# Patient Record
Sex: Male | Born: 1960 | Race: White | Hispanic: No | Smoking: Never smoker
Health system: Southern US, Community
[De-identification: ages and names within clinical notes are randomized; demographics above are authoritative.]

## PROBLEM LIST (undated history)

## (undated) DIAGNOSIS — E785 Hyperlipidemia, unspecified: Secondary | ICD-10-CM

## (undated) DIAGNOSIS — I10 Essential (primary) hypertension: Secondary | ICD-10-CM

## (undated) DIAGNOSIS — E119 Type 2 diabetes mellitus without complications: Secondary | ICD-10-CM

---

## 2003-12-12 ENCOUNTER — Ambulatory Visit: Payer: Self-pay | Admitting: Family Medicine

## 2003-12-13 ENCOUNTER — Ambulatory Visit: Payer: Self-pay | Admitting: Family Medicine

## 2003-12-18 ENCOUNTER — Emergency Department: Payer: Self-pay | Admitting: Emergency Medicine

## 2004-03-08 ENCOUNTER — Emergency Department: Payer: Self-pay | Admitting: Emergency Medicine

## 2004-03-30 ENCOUNTER — Ambulatory Visit: Payer: Self-pay | Admitting: Surgery

## 2004-06-04 ENCOUNTER — Emergency Department: Payer: Self-pay | Admitting: Emergency Medicine

## 2004-11-12 ENCOUNTER — Emergency Department: Payer: Self-pay | Admitting: Emergency Medicine

## 2007-03-16 ENCOUNTER — Ambulatory Visit: Payer: Self-pay | Admitting: Internal Medicine

## 2007-04-29 ENCOUNTER — Ambulatory Visit: Payer: Self-pay | Admitting: Family Medicine

## 2008-05-20 ENCOUNTER — Ambulatory Visit: Payer: Self-pay | Admitting: Family Medicine

## 2008-11-11 ENCOUNTER — Ambulatory Visit: Payer: Self-pay | Admitting: Internal Medicine

## 2008-12-20 ENCOUNTER — Emergency Department: Payer: Self-pay | Admitting: Internal Medicine

## 2010-10-04 ENCOUNTER — Ambulatory Visit: Payer: Self-pay | Admitting: Family Medicine

## 2010-10-30 ENCOUNTER — Emergency Department: Payer: Self-pay | Admitting: Emergency Medicine

## 2012-06-04 IMAGING — CT CT ABD-PELV W/ CM
1 of 2 series · 15 of 32 positions shown, 19 images · non-contrast
Comparison: none

REASON FOR EXAM: (1) LLQ  pain  high wbc; (2) same
COMMENTS:

PROCEDURE:     CT  - CT ABDOMEN / PELVIS  W  - October 30, 2010  [DATE]
RESULT:     CT abdomen and pelvis dated 10/30/2010 comparison made to prior
study dated 10/04/2010.
TECHNIQUE: Helical 3 mm sections were obtained from the lung bases through
the symphysis status post intravenous administration of 100 millimeters of
Ksovue-40Z and oral contrast.

[Series 2: 3mm soft tissue · axial · 0.87mm/px · z∈[-958,-506]mm · 15 of 165 slices shown, 19 images]
[im 7/165  soft-tissue]
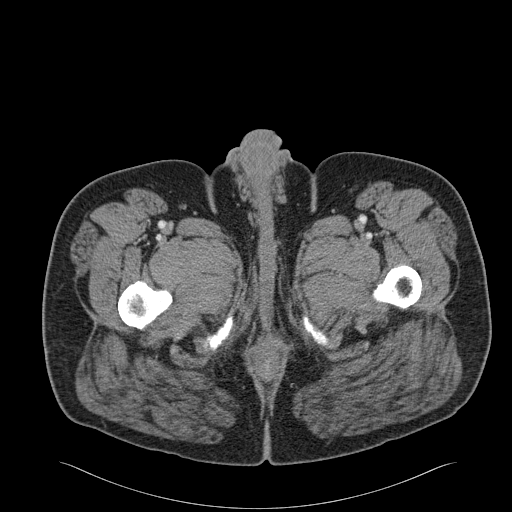
[im 7/165  bone]
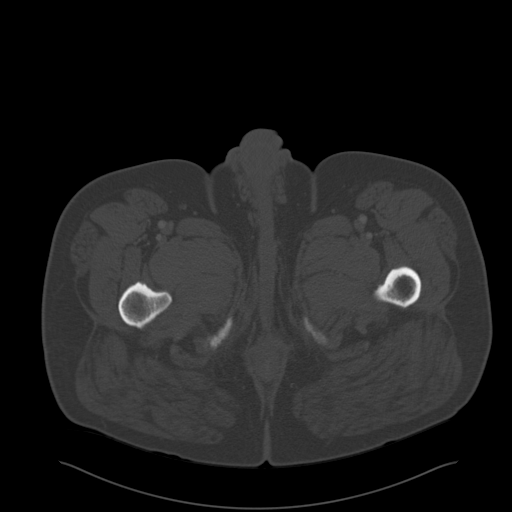
[im 21/165  soft-tissue]
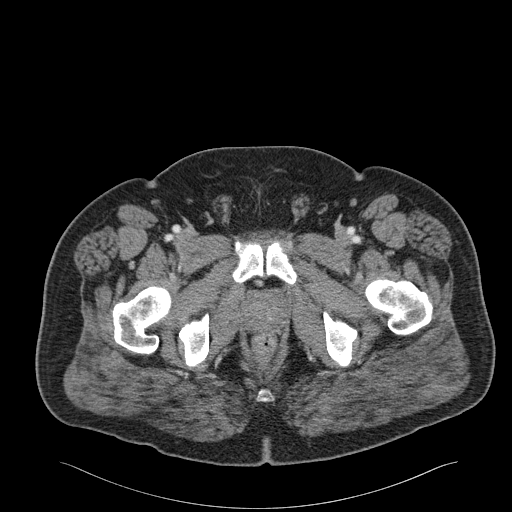
[im 35/165  soft-tissue]
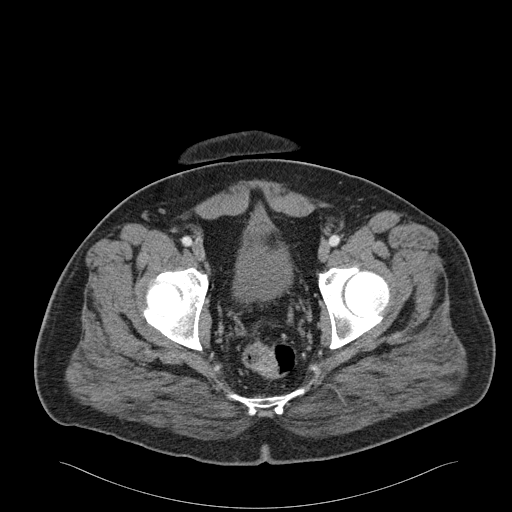
[im 48/165  soft-tissue]
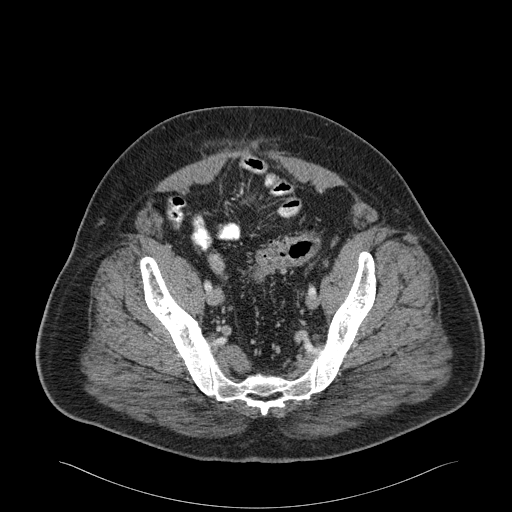
[im 55/165  soft-tissue]
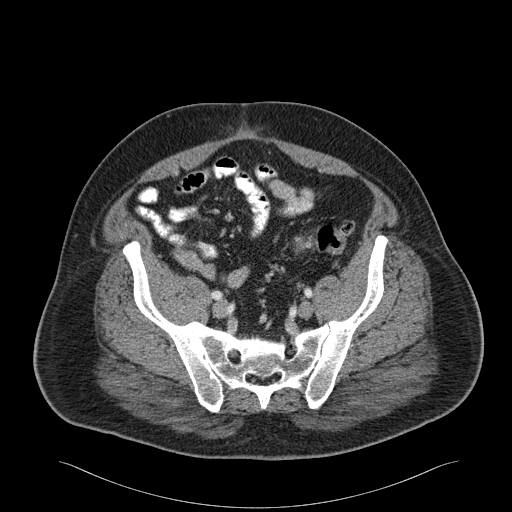
[im 69/165  soft-tissue]
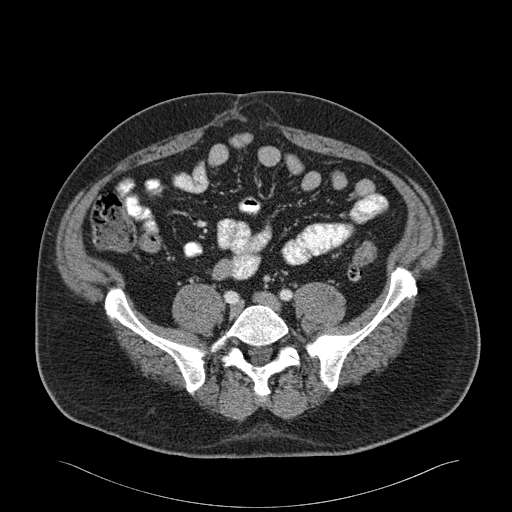
[im 83/165  soft-tissue]
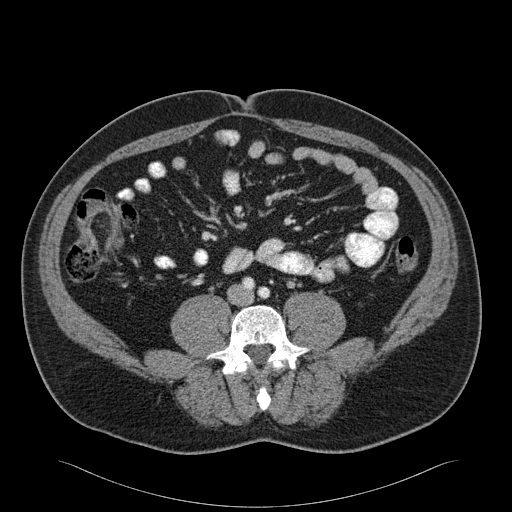
[im 96/165  soft-tissue]
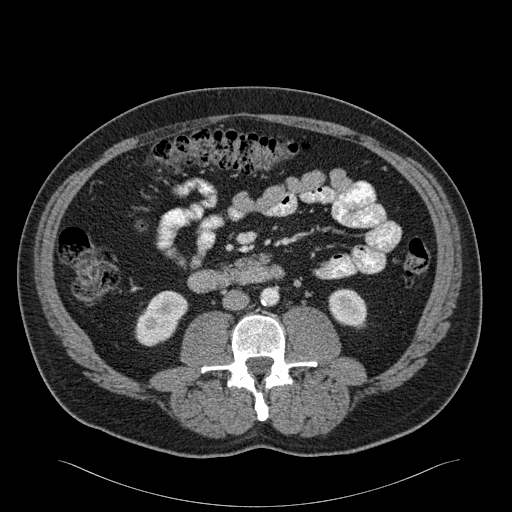
[im 110/165  soft-tissue]
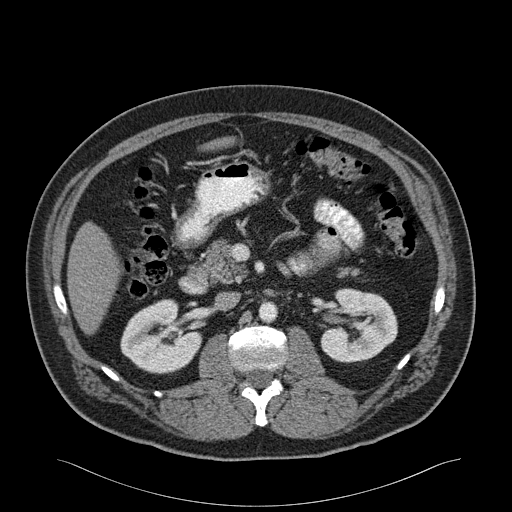
[im 110/165  bone]
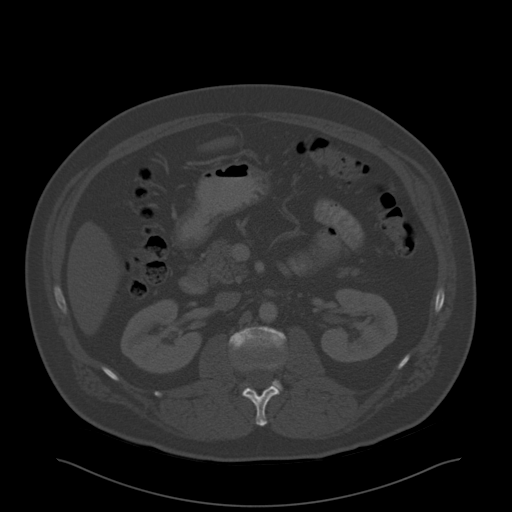
[im 117/165  soft-tissue]
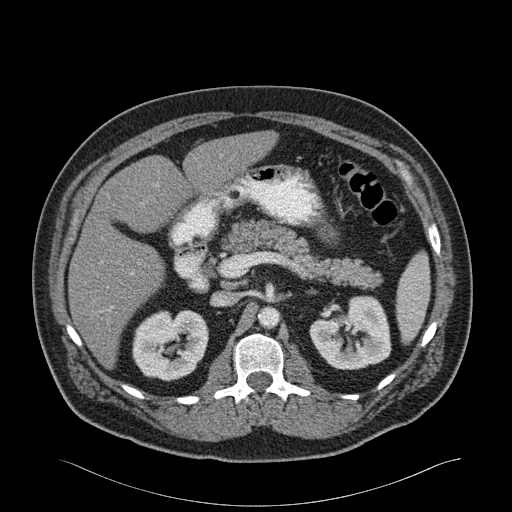
[im 130/165  soft-tissue]
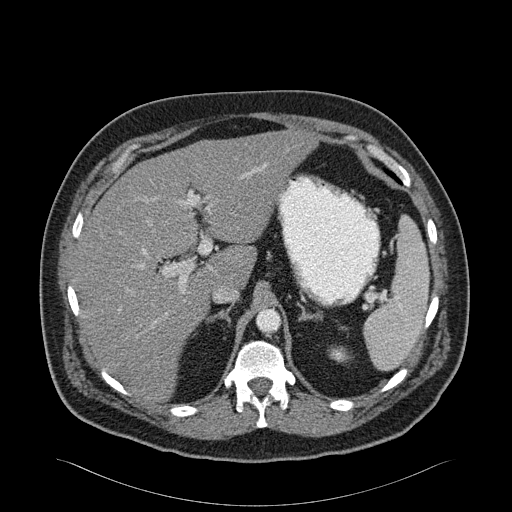
[im 137/165  lung]
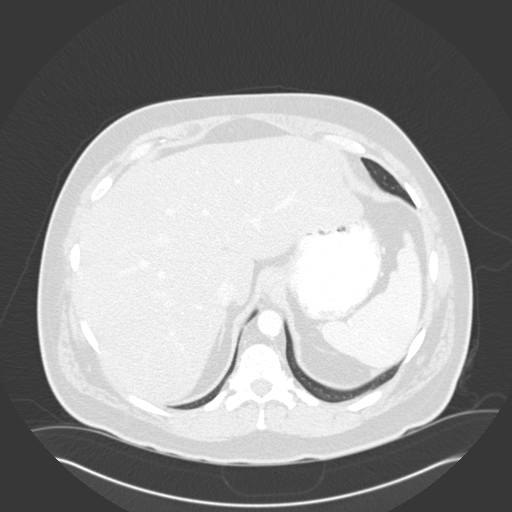
[im 144/165  soft-tissue]
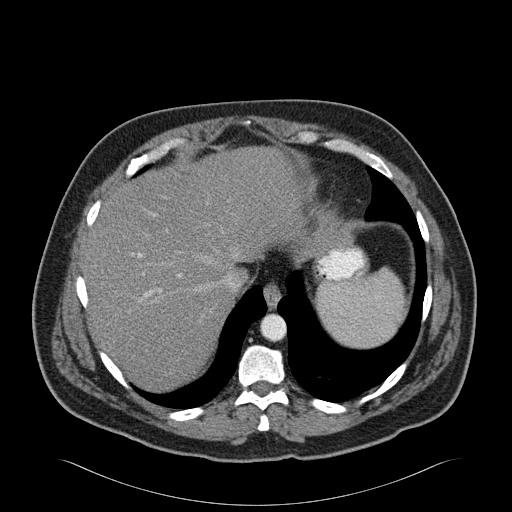
[im 144/165  lung]
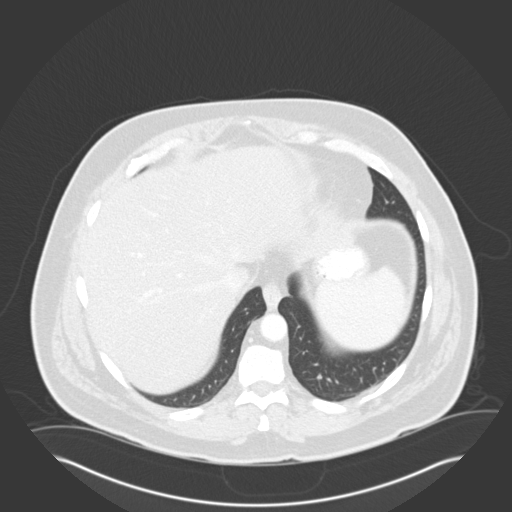
[im 151/165  lung]
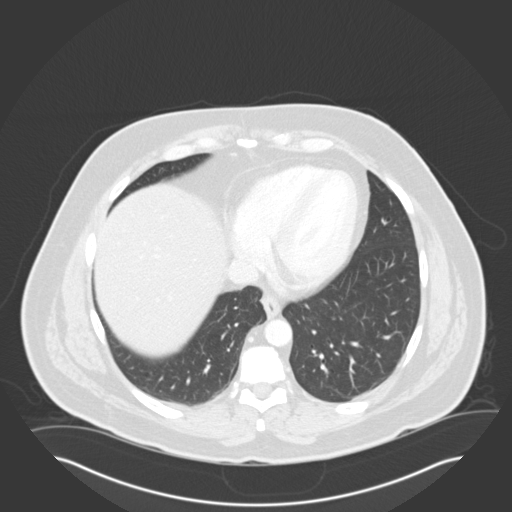
[im 158/165  soft-tissue]
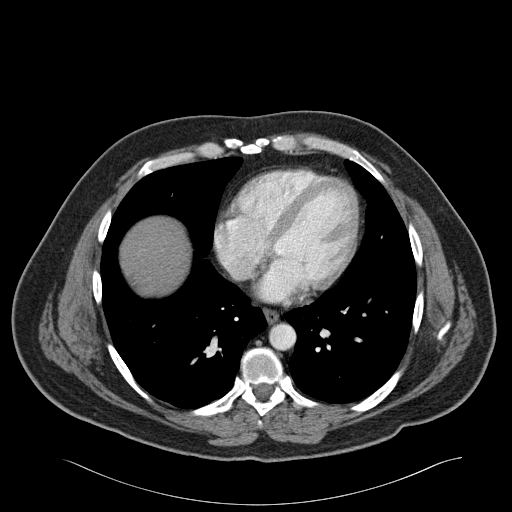
[im 158/165  lung]
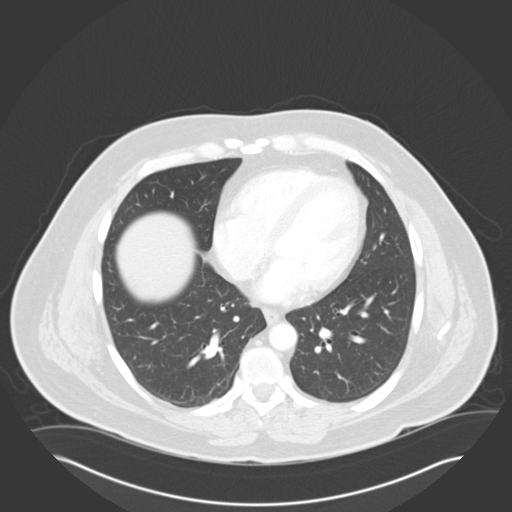

[15 of 32 positions shown; findings below may reference images not displayed]

FINDINGS: The lung bases are unremarkable.

The liver demonstrates a diffuse low attenuating architecture. The spleen,
adrenals, pancreas, left kidney are unremarkable. Low attenuating foci
identified within the right kidney likely thinning cysts. There is no
evidence of abdominal aortic aneurysm. There is diverticulosis appreciated
within the sigmoid colon.

There is no evidence of abdominal or pelvic free fluid loculated fluid
collections, masses, nor adenopathy.  A small splenule is appreciated within
the hilum of the spleen. There is no CT evidence of bowel obstruction no
secondary signs reflecting enteritis, nor appendicitis. The appendix is
identified and is unremarkable. Postsurgical changes are appreciated within
the sigmoid colon. Within the mid the distal sigmoid colon there is an area
suspicious for mild bowel wall thickening. This is just distal to the
postsurgical site and alternatively may represent an area of scarring. Bowel
wall thickening due to inflammation from infectious or inflammatory etiology
cannot excluded. No evidence of pelvic free fluid nor loculated fluid
collections.

A fat-containing ventral wall hernia is appreciated which also contains a
loop of small bowel appears unremarkable.
IMPRESSION: Postsurgical changes versus early or mild colitis involving
the sigmoid colon. There is evidence of mild to diverticulosis in the
sigmoid colon and this may were present early or mild diverticulitis.
Findings primary appreciated on image 118.

## 2014-11-04 ENCOUNTER — Encounter: Payer: Self-pay | Admitting: Emergency Medicine

## 2014-11-04 ENCOUNTER — Emergency Department
Admission: EM | Admit: 2014-11-04 | Discharge: 2014-11-04 | Disposition: A | Discharge status: Home / Self Care | Payer: BLUE CROSS/BLUE SHIELD | Attending: Emergency Medicine | Admitting: Emergency Medicine

## 2014-11-04 ENCOUNTER — Emergency Department: Payer: BLUE CROSS/BLUE SHIELD

## 2014-11-04 ENCOUNTER — Other Ambulatory Visit: Payer: Self-pay

## 2014-11-04 DIAGNOSIS — Z79899 Other long term (current) drug therapy: Secondary | ICD-10-CM | POA: Diagnosis not present

## 2014-11-04 DIAGNOSIS — E119 Type 2 diabetes mellitus without complications: Secondary | ICD-10-CM | POA: Diagnosis not present

## 2014-11-04 DIAGNOSIS — R002 Palpitations: Secondary | ICD-10-CM | POA: Insufficient documentation

## 2014-11-04 DIAGNOSIS — Z7982 Long term (current) use of aspirin: Secondary | ICD-10-CM | POA: Diagnosis not present

## 2014-11-04 DIAGNOSIS — I10 Essential (primary) hypertension: Secondary | ICD-10-CM | POA: Insufficient documentation

## 2014-11-04 DIAGNOSIS — R079 Chest pain, unspecified: Secondary | ICD-10-CM | POA: Diagnosis present

## 2014-11-04 HISTORY — DX: Essential (primary) hypertension: I10

## 2014-11-04 HISTORY — DX: Hyperlipidemia, unspecified: E78.5

## 2014-11-04 HISTORY — DX: Type 2 diabetes mellitus without complications: E11.9

## 2014-11-04 LAB — CBC
HCT: 39.6 % — ABNORMAL LOW (ref 40.0–52.0)
Hemoglobin: 13.3 g/dL (ref 13.0–18.0)
MCH: 28.2 pg (ref 26.0–34.0)
MCHC: 33.7 g/dL (ref 32.0–36.0)
MCV: 83.8 fL (ref 80.0–100.0)
PLATELETS: 220 10*3/uL (ref 150–440)
RBC: 4.73 MIL/uL (ref 4.40–5.90)
RDW: 14.5 % (ref 11.5–14.5)
WBC: 11.3 10*3/uL — ABNORMAL HIGH (ref 3.8–10.6)

## 2014-11-04 LAB — BASIC METABOLIC PANEL
Anion gap: 9 (ref 5–15)
BUN: 13 mg/dL (ref 6–20)
CALCIUM: 9.2 mg/dL (ref 8.9–10.3)
CO2: 24 mmol/L (ref 22–32)
CREATININE: 0.71 mg/dL (ref 0.61–1.24)
Chloride: 105 mmol/L (ref 101–111)
Glucose, Bld: 115 mg/dL — ABNORMAL HIGH (ref 65–99)
Potassium: 4 mmol/L (ref 3.5–5.1)
Sodium: 138 mmol/L (ref 135–145)

## 2014-11-04 LAB — TROPONIN I

## 2014-11-04 MED ORDER — ASPIRIN 81 MG PO CHEW
243.0000 mg | CHEWABLE_TABLET | Freq: Once | ORAL | Status: AC
  Administered 2014-11-04: 243 mg via ORAL

## 2014-11-04 MED ORDER — ASPIRIN 81 MG PO CHEW
CHEWABLE_TABLET | ORAL | Status: AC
  Administered 2014-11-04: 243 mg via ORAL
  Filled 2014-11-04: qty 3

## 2014-11-04 NOTE — ED Notes (Signed)
Pt to ed with c/o chest pain that started intermittently about 3 weeks ago, states today and last night worse.  Pt reports pain is sharpe on left side of chest, reports sob, weakness, back pain, diaphoresis and nausea associated with the pain.

## 2014-11-04 NOTE — Discharge Instructions (Signed)
Your EKG looked normal both times we assessed it. Your cardiac enzymes were normal 2. You've been stable and overall comfortable in the emergency department. You have appear safe to go home. Follow-up with Dr. Darrold Junker as planned. Call to see if they can see earlier than your current scheduled appointment. Follow-up with your primary care doctor. Return to the emergency department if you have worsening pain, shortness of breath, or other urgent concerns.  Chest Pain (Nonspecific) It is often hard to give a specific diagnosis for the cause of chest pain. There is always a chance that your pain could be related to something serious, such as a heart attack or a blood clot in the lungs. You need to follow up with your health care provider for further evaluation. CAUSES   Heartburn.  Pneumonia or bronchitis.  Anxiety or stress.  Inflammation around your heart (pericarditis) or lung (pleuritis or pleurisy).  A blood clot in the lung.  A collapsed lung (pneumothorax). It can develop suddenly on its own (spontaneous pneumothorax) or from trauma to the chest.  Shingles infection (herpes zoster virus). The chest wall is composed of bones, muscles, and cartilage. Any of these can be the source of the pain.  The bones can be bruised by injury.  The muscles or cartilage can be strained by coughing or overwork.  The cartilage can be affected by inflammation and become sore (costochondritis). DIAGNOSIS  Lab tests or other studies may be needed to find the cause of your pain. Your health care provider may have you take a test called an ambulatory electrocardiogram (ECG). An ECG records your heartbeat patterns over a 24-hour period. You may also have other tests, such as:  Transthoracic echocardiogram (TTE). During echocardiography, sound waves are used to evaluate how blood flows through your heart.  Transesophageal echocardiogram (TEE).  Cardiac monitoring. This allows your health care provider  to monitor your heart rate and rhythm in real time.  Holter monitor. This is a portable device that records your heartbeat and can help diagnose heart arrhythmias. It allows your health care provider to track your heart activity for several days, if needed.  Stress tests by exercise or by giving medicine that makes the heart beat faster. TREATMENT   Treatment depends on what may be causing your chest pain. Treatment may include:  Acid blockers for heartburn.  Anti-inflammatory medicine.  Pain medicine for inflammatory conditions.  Antibiotics if an infection is present.  You may be advised to change lifestyle habits. This includes stopping smoking and avoiding alcohol, caffeine, and chocolate.  You may be advised to keep your head raised (elevated) when sleeping. This reduces the chance of acid going backward from your stomach into your esophagus. Most of the time, nonspecific chest pain will improve within 2-3 days with rest and mild pain medicine.  HOME CARE INSTRUCTIONS   If antibiotics were prescribed, take them as directed. Finish them even if you start to feel better.  For the next few days, avoid physical activities that bring on chest pain. Continue physical activities as directed.  Do not use any tobacco products, including cigarettes, chewing tobacco, or electronic cigarettes.  Avoid drinking alcohol.  Only take medicine as directed by your health care provider.  Follow your health care provider's suggestions for further testing if your chest pain does not go away.  Keep any follow-up appointments you made. If you do not go to an appointment, you could develop lasting (chronic) problems with pain. If there is any problem keeping  an appointment, call to reschedule. SEEK MEDICAL CARE IF:   Your chest pain does not go away, even after treatment.  You have a rash with blisters on your chest.  You have a fever. SEEK IMMEDIATE MEDICAL CARE IF:   You have increased  chest pain or pain that spreads to your arm, neck, jaw, back, or abdomen.  You have shortness of breath.  You have an increasing cough, or you cough up blood.  You have severe back or abdominal pain.  You feel nauseous or vomit.  You have severe weakness.  You faint.  You have chills. This is an emergency. Do not wait to see if the pain will go away. Get medical help at once. Call your local emergency services (911 in [place] Do not drive yourself to the hospital. MAKE SURE YOU:   Understand these instructions.  Will watch your condition.  Will get help right away if you are not doing well or get worse. Document Released: 12/05/2004 Document Revised: 03/02/2013 Document Reviewed: 10/01/2007 [facility] Patient Information 2015 [place], [place]. This information is not intended to replace advice given to you by your health care provider. Make sure you discuss any questions you have with your health care provider.

## 2014-11-04 NOTE — ED Provider Notes (Signed)
[facility] Emergency Department Provider Note  ____________________________________________  Time seen: 11:20 AM  I have reviewed the triage vital signs and the nursing notes.   HISTORY  Chief Complaint Chest Pain     HPI Andre Horn is a 54 y.o. male complains of having had palpitations over the past 2-3 weeks. He feels his rate has been fast and irregular, but he has not taken his pulse. He called yesterday to arrange an appointment to see Dr. Darrold Junker and has an appointment next week.  This morning, approximately 2 AM, he awoke with discomfort in his chest. It's unclear how long this lasted. He is able to go back to sleep.  He went to work this morning. At work he began to have pain again, and this time he became a little nauseous, diaphoretic, and had a little bit of shortness of breath.  At this time, the patient is not having any pain, although he does appear a little bit anxious. The patient took a 81 mg aspirin this morning as usual.    Past Medical History  Diagnosis Date  . Diabetes mellitus without complication   . Hypertension   . Hyperlipidemia     There are no active problems to display for this patient.   History reviewed. No pertinent past surgical history.  Current Outpatient Rx  Name  Route  Sig  Dispense  Refill  . aspirin EC 81 MG tablet   Oral   Take 1 tablet by mouth daily.         Marland Kitchen atorvastatin (LIPITOR) 80 MG tablet   Oral   Take 1 tablet by mouth daily.         Marland Kitchen gabapentin (NEURONTIN) 100 MG capsule   Oral   Take 1 capsule by mouth daily.      10   . lisinopril (PRINIVIL,ZESTRIL) 20 MG tablet   Oral   Take 1 tablet by mouth daily.      5   . metFORMIN (GLUCOPHAGE) 1000 MG tablet   Oral   Take 1 tablet by mouth 2 (two) times daily with a meal.      7     Allergies Review of patient's allergies indicates no known allergies.  History reviewed. No pertinent family history.  Social  History Social History  Substance Use Topics  . Smoking status: Never Smoker   . Smokeless tobacco: None  . Alcohol Use: No    Review of Systems  Constitutional: Negative for fever. ENT: Negative for sore throat. Cardiovascular: Palpitations and chest pain. See history of present illness Respiratory: Negative for shortness of breath. Gastrointestinal: Negative for abdominal pain, vomiting and diarrhea. Genitourinary: Negative for dysuria. Musculoskeletal: No myalgias or injuries. Skin: Negative for rash. Some diaphoresis this morning. See history of present illness Neurological: Negative for headaches   10-point ROS otherwise negative.  ____________________________________________   PHYSICAL EXAM:  VITAL SIGNS: ED Triage Vitals  Enc Vitals Group     BP 11/04/14 1049 126/90 mmHg     Pulse Rate 11/04/14 1049 86     Resp 11/04/14 1049 20     Temp 11/04/14 1049 98.2 F (36.8 C)     Temp Source 11/04/14 1049 Oral     SpO2 11/04/14 1049 97 %     Weight 11/04/14 1049 235 lb (106.595 kg)     Height 11/04/14 1049 5\' 7"  (1.702 m)     Head Cir --      Peak Flow --  Pain Score 11/04/14 1049 3     Pain Loc --      Pain Edu? --      Excl. in GC? --     Constitutional: Alert and oriented. Well appearing and in no distress. ENT   Head: Normocephalic and atraumatic.   Nose: No congestion/rhinnorhea.   Mouth/Throat: Mucous membranes are moist. Cardiovascular: Normal rate, regular rhythm, no murmur noted Respiratory:  Normal respiratory effort, no tachypnea.    Breath sounds are clear and equal bilaterally.  Gastrointestinal: Soft and nontender. No distention.  Back: No muscle spasm, no tenderness, no CVA tenderness. Musculoskeletal: No deformity noted. Nontender with normal range of motion in all extremities.  No noted edema. Neurologic:  Normal speech and language. No gross focal neurologic deficits are appreciated.  Skin:  Skin is warm, dry. No rash  noted. Psychiatric: Mood and affect are normal. Speech and behavior are normal.  ____________________________________________    LABS (pertinent positives/negatives)  Labs Reviewed  BASIC METABOLIC PANEL - Abnormal; Notable for the following:    Glucose, Bld 115 (*)    All other components within normal limits  CBC - Abnormal; Notable for the following:    WBC 11.3 (*)    HCT 39.6 (*)    All other components within normal limits  TROPONIN I  TROPONIN I     ____________________________________________   EKG  ED ECG REPORT I, Nikitha Mode W, the attending physician, personally viewed and interpreted this ECG.   Date: 11/04/2014  EKG Time: 1047  Rate: 87  Rhythm: Normal sinus rhythm  Axis: Normal  Intervals: Normal  ST&T Change: None noted   ____________________________________________    RADIOLOGY  Chest x-ray:  IMPRESSION: No active cardiopulmonary disease.  ____________________________________________    INITIAL IMPRESSION / ASSESSMENT AND PLAN / ED COURSE  Pertinent labs & imaging results that were available during my care of the patient were reviewed by me and considered in my medical decision making (see chart for details).  Pleasant 54 year old male with recent palpitations and now with intermittent chest pain.  His initial EKG looks normal. Blood tests are pending. Due to the episode he had at work just prior to arrival, I have discussed having the patient stating the emergency department for at least 1 repeat of his troponin and EKG. We will complement the 81 mg of aspirin he took earlier with 3 additional aspirins.  ----------------------------------------- 1:49 PM on 11/04/2014 -----------------------------------------  Initial blood tests are unremarkable with a negative troponin level. His chest x-ray looks good. Second troponin and EKG pending.  ----------------------------------------- 2:57 PM on  11/04/2014 -----------------------------------------  ED ECG REPORT I, Rosy Estabrook W, the attending physician, personally viewed and interpreted this ECG.   Date: 11/04/2014  EKG Time: 1440 7 PM  Rate: 75  Rhythm: Normal sinus rhythm  Axis: Normal  Intervals: Normal  ST&T Change: None noted  Second troponin pending  ----------------------------------------- 3:37 PM on 11/04/2014 -----------------------------------------  Second troponin is negative. Reassessment finds the patient comfortable. He is not having chest pain at this time. He's been stable throughout his stay in the emergency department. We've seen no signs of cardiac dysrhythmia.  The patient is an appointment with Dr. Darrold Junker on Monday (earlier in the interaction, I thought his appointment was set for next Friday). I've asked him to continue to take a 81 mg aspirin once a day.  My suspicion for this being cardiac is low. The patient appears stable and safe for discharge. I have reviewed the risks with the patient  and he agrees.   ____________________________________________   FINAL CLINICAL IMPRESSION(S) / ED DIAGNOSES  Final diagnoses:  Chest pain, unspecified chest pain type      Darien Ramus, MD 11/04/14 [phone]

## 2014-11-04 NOTE — ED Notes (Signed)
Chest pain off and on for last 3 weeks. This AM was awakened by CP around 2AM. Went away after a few minutes. Went to work and began to feel bad. Sweating and nauseated at work. Left work and drove here.

## 2014-12-06 ENCOUNTER — Encounter: Admission: RE | Payer: Self-pay | Source: Ambulatory Visit

## 2014-12-06 ENCOUNTER — Ambulatory Visit: Admission: RE | Admit: 2014-12-06 | Payer: BLUE CROSS/BLUE SHIELD | Source: Ambulatory Visit | Admitting: Cardiology

## 2014-12-06 SURGERY — LEFT HEART CATH
Anesthesia: Moderate Sedation

## 2016-06-09 IMAGING — CR DG CHEST 2V
2 series · 2 of 2 positions shown · non-contrast
Comparison: None

CLINICAL DATA: Chest pain

EXAM:
CHEST  2 VIEW

[chest pa]
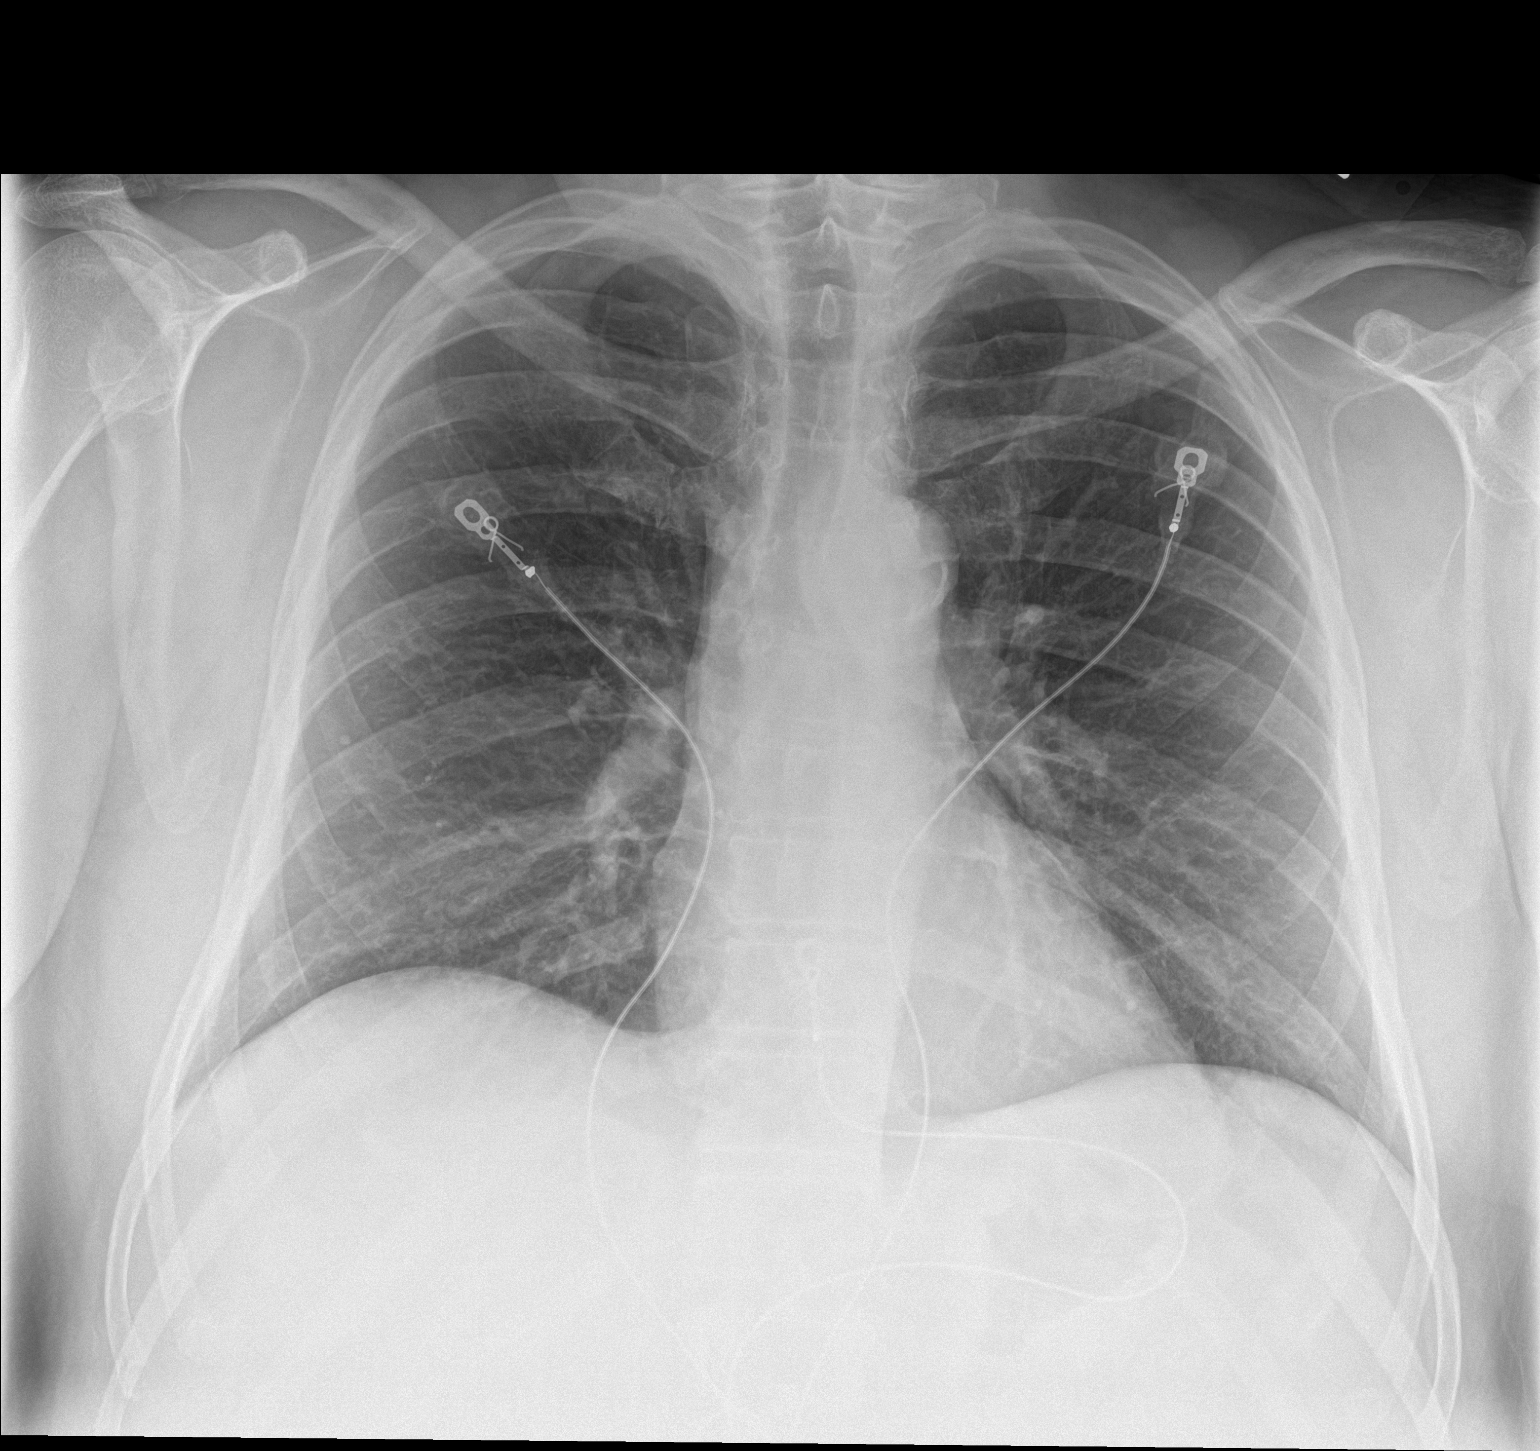

[chest lat]
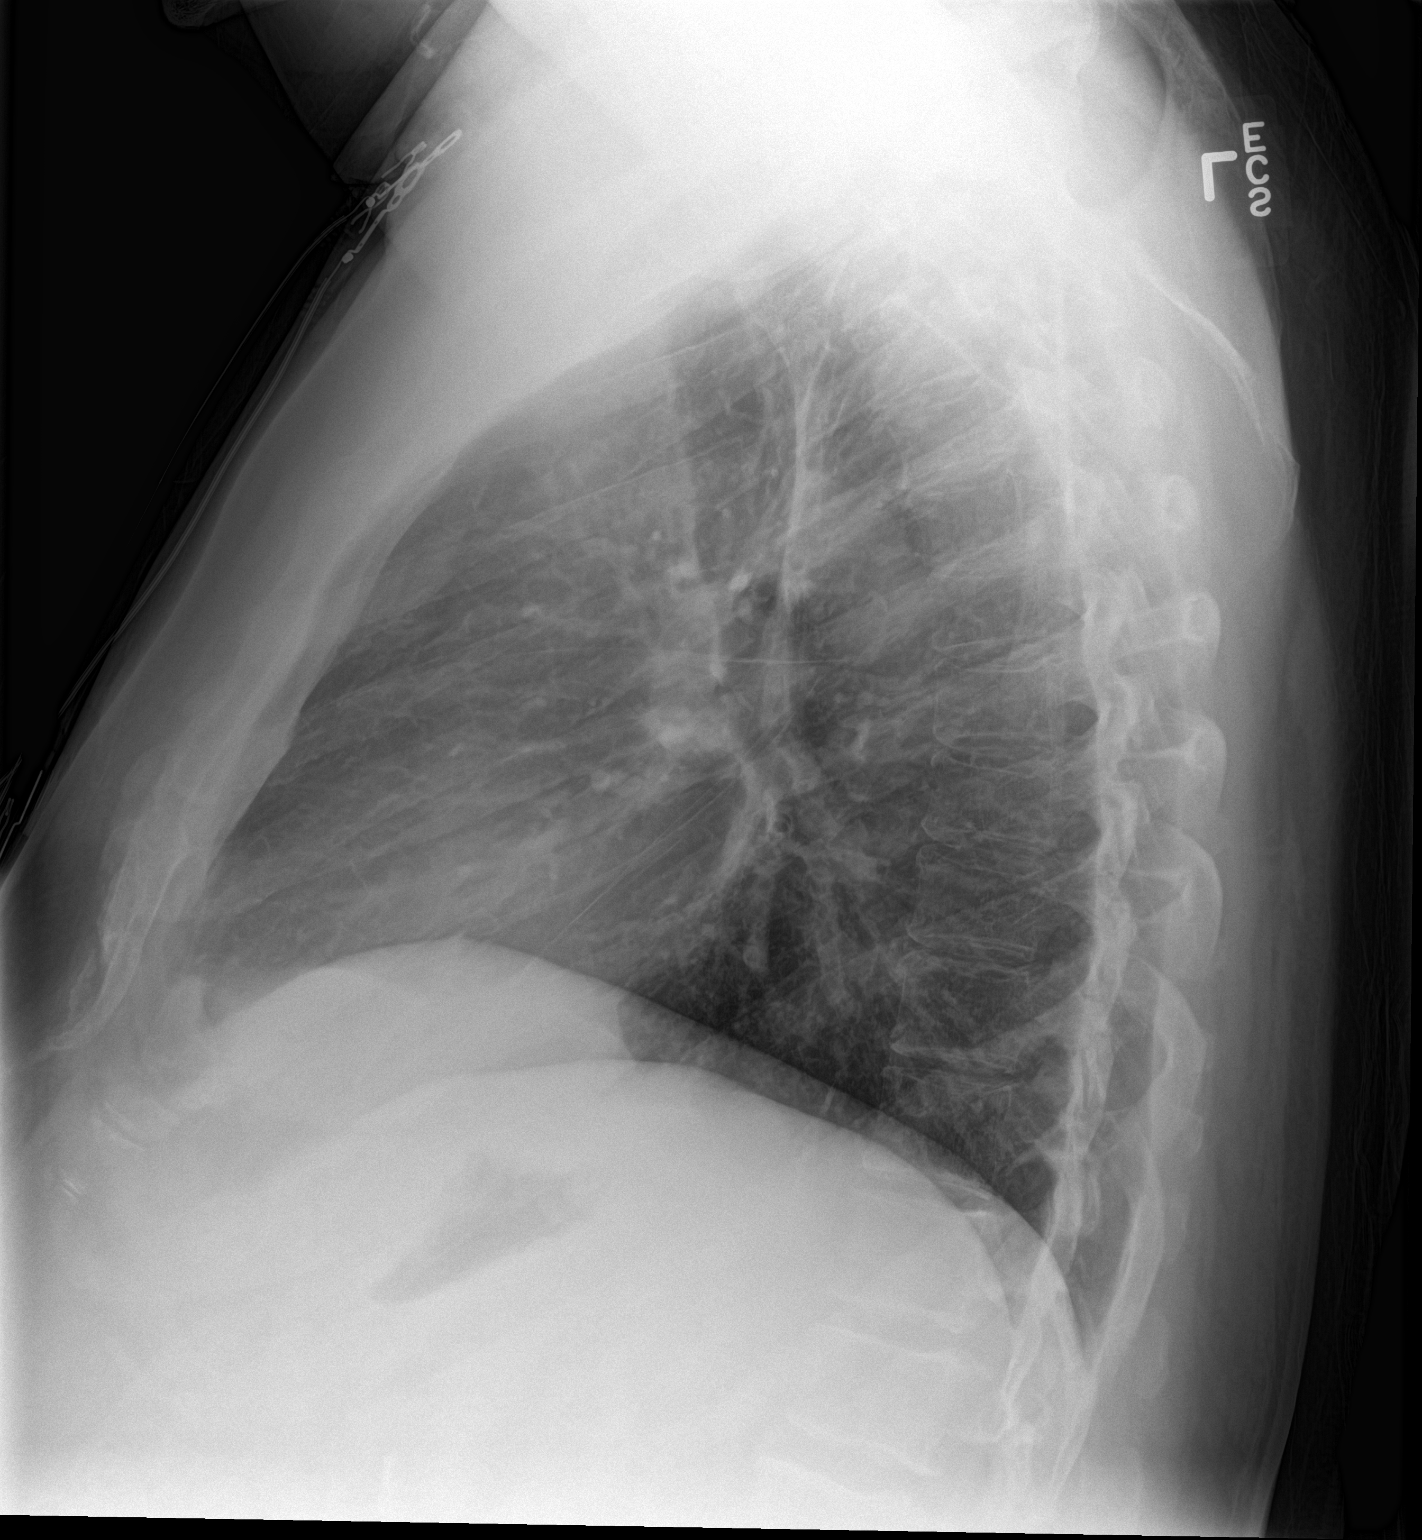

[2 of 2 positions shown; findings below may reference images not displayed]

FINDINGS: Cardiomediastinal silhouette is unremarkable. No acute infiltrate or
pleural effusion. No pulmonary edema. Bony thorax is unremarkable.
IMPRESSION: No active cardiopulmonary disease.

## 2018-10-15 ENCOUNTER — Encounter: Payer: Self-pay | Admitting: *Deleted

## 2019-05-05 ENCOUNTER — Encounter: Payer: Self-pay | Admitting: *Deleted

## 2021-03-06 ENCOUNTER — Other Ambulatory Visit: Payer: Self-pay

## 2021-03-06 ENCOUNTER — Encounter (HOSPITAL_COMMUNITY): Payer: Self-pay

## 2021-03-06 ENCOUNTER — Emergency Department (HOSPITAL_COMMUNITY)
Admission: EM | Admit: 2021-03-06 | Discharge: 2021-03-06 | Disposition: A | Discharge status: Home / Self Care | Payer: Medicare (Managed Care) | Attending: Emergency Medicine | Admitting: Emergency Medicine

## 2021-03-06 DIAGNOSIS — E119 Type 2 diabetes mellitus without complications: Secondary | ICD-10-CM | POA: Insufficient documentation

## 2021-03-06 DIAGNOSIS — Z7984 Long term (current) use of oral hypoglycemic drugs: Secondary | ICD-10-CM | POA: Diagnosis not present

## 2021-03-06 DIAGNOSIS — I10 Essential (primary) hypertension: Secondary | ICD-10-CM | POA: Insufficient documentation

## 2021-03-06 DIAGNOSIS — Z79899 Other long term (current) drug therapy: Secondary | ICD-10-CM | POA: Diagnosis not present

## 2021-03-06 DIAGNOSIS — K5792 Diverticulitis of intestine, part unspecified, without perforation or abscess without bleeding: Secondary | ICD-10-CM

## 2021-03-06 DIAGNOSIS — R1032 Left lower quadrant pain: Secondary | ICD-10-CM | POA: Diagnosis present

## 2021-03-06 LAB — URINALYSIS, ROUTINE W REFLEX MICROSCOPIC
Bilirubin Urine: NEGATIVE
Glucose, UA: NEGATIVE mg/dL
Hgb urine dipstick: NEGATIVE
Ketones, ur: NEGATIVE mg/dL
Leukocytes,Ua: NEGATIVE
Nitrite: NEGATIVE
Protein, ur: NEGATIVE mg/dL
Specific Gravity, Urine: >1.03 — ABNORMAL HIGH (ref 1.005–1.030)
pH: 5.5 (ref 5.0–8.0)

## 2021-03-06 LAB — CBC WITH DIFFERENTIAL/PLATELET
Abs Immature Granulocytes: 0.06 10*3/uL (ref 0.00–0.07)
Basophils Absolute: 0.1 10*3/uL (ref 0.0–0.1)
Basophils Relative: 1 %
Eosinophils Absolute: 0.4 10*3/uL (ref 0.0–0.5)
Eosinophils Relative: 3 %
HCT: 40 % (ref 39.0–52.0)
Hemoglobin: 13.8 g/dL (ref 13.0–17.0)
Immature Granulocytes: 1 %
Lymphocytes Relative: 18 %
Lymphs Abs: 2.3 10*3/uL (ref 0.7–4.0)
MCH: 28.8 pg (ref 26.0–34.0)
MCHC: 34.5 g/dL (ref 30.0–36.0)
MCV: 83.5 fL (ref 80.0–100.0)
Monocytes Absolute: 1.1 10*3/uL — ABNORMAL HIGH (ref 0.1–1.0)
Monocytes Relative: 8 %
Neutro Abs: 9.4 10*3/uL — ABNORMAL HIGH (ref 1.7–7.7)
Neutrophils Relative %: 69 %
Platelets: 234 10*3/uL (ref 150–400)
RBC: 4.79 MIL/uL (ref 4.22–5.81)
RDW: 13.8 % (ref 11.5–15.5)
WBC: 13.3 10*3/uL — ABNORMAL HIGH (ref 4.0–10.5)
nRBC: 0 % (ref 0.0–0.2)

## 2021-03-06 LAB — BASIC METABOLIC PANEL
Anion gap: 10 (ref 5–15)
BUN: 14 mg/dL (ref 8–23)
CO2: 22 mmol/L (ref 22–32)
Calcium: 9.1 mg/dL (ref 8.9–10.3)
Chloride: 100 mmol/L (ref 98–111)
Creatinine, Ser: 0.7 mg/dL (ref 0.61–1.24)
GFR, Estimated: >60 mL/min (ref >60)
Glucose, Bld: 201 mg/dL — ABNORMAL HIGH (ref 70–99)
Potassium: 3.9 mmol/L (ref 3.5–5.1)
Sodium: 132 mmol/L — ABNORMAL LOW (ref 135–145)

## 2021-03-06 MED ORDER — HYDROCODONE-ACETAMINOPHEN 5-325 MG PO TABS: 1.0000 | ORAL_TABLET | ORAL | 0 refills | Status: AC | PRN

## 2021-03-06 MED ORDER — AMOXICILLIN-POT CLAVULANATE 875-125 MG PO TABS: 1.0000 | ORAL_TABLET | Freq: Two times a day (BID) | ORAL | 0 refills | Status: AC

## 2021-03-06 MED ORDER — ONDANSETRON HCL 4 MG/2ML IJ SOLN
4.0000 mg | Freq: Once | INTRAMUSCULAR | Status: AC
  Administered 2021-03-06: 05:00:00 4 mg via INTRAVENOUS
  Filled 2021-03-06: qty 2

## 2021-03-06 MED ORDER — SODIUM CHLORIDE 0.9 % IV BOLUS
500.0000 mL | Freq: Once | INTRAVENOUS | Status: AC
  Administered 2021-03-06: 05:00:00 500 mL via INTRAVENOUS

## 2021-03-06 MED ORDER — SODIUM CHLORIDE 0.9 % IV SOLN
3.0000 g | Freq: Once | INTRAVENOUS | Status: AC
  Administered 2021-03-06: 06:00:00 3 g via INTRAVENOUS
  Filled 2021-03-06: qty 8

## 2021-03-06 MED ORDER — HYDROMORPHONE HCL 1 MG/ML IJ SOLN
1.0000 mg | Freq: Once | INTRAMUSCULAR | Status: AC
  Administered 2021-03-06: 05:00:00 1 mg via INTRAVENOUS
  Filled 2021-03-06: qty 1

## 2021-03-06 NOTE — ED Triage Notes (Signed)
Pov form home with wife. Cc of lower abdominal pain that has been going on since the 22nd but slowly worsening. Says that it makes his back hurt too.  +n/d. Says It hurts to have a BM and to urinate.  Hx of diverticulitis and it feels similar to that.

## 2021-03-06 NOTE — ED Provider Notes (Signed)
[facility] EMERGENCY DEPARTMENT Provider Note   CSN: [id number] Arrival date & time: 03/06/21  0354     History Chief Complaint  Patient presents with   Abdominal Pain    Andre Horn is a 60 y.o. male.  Patient presents to the emergency department for evaluation of abdominal pain.  Patient reports progressively worsening lower abdominal pain, left greater than right.  Patient has noticed painful defecation as well as pain with urination.  Patient reports that this feels similar to when he had diverticulitis.      Past Medical History:  Diagnosis Date   Diabetes mellitus without complication (HCC)    Hyperlipidemia    Hypertension     There are no problems to display for this patient.   Past Surgical History:  Procedure Laterality Date   ABDOMINAL SURGERY     CHOLECYSTECTOMY     RECONSTRUCTION TENDON PULLEY W/ TENDON / FASCIAL GRAFT OF HAND / FINGER         History reviewed. No pertinent family history.  Social History   Tobacco Use   Smoking status: Never  Substance Use Topics   Alcohol use: No   Drug use: No    Home Medications Prior to Admission medications   Medication Sig Start Date End Date Taking? Authorizing Provider  amoxicillin-clavulanate (AUGMENTIN) 875-125 MG tablet Take 1 tablet by mouth 2 (two) times daily. 03/06/21  Yes Yola Paradiso, Canary Brim, MD  HYDROcodone-acetaminophen (NORCO/VICODIN) 5-325 MG tablet Take 1 tablet by mouth every 4 (four) hours as needed for moderate pain. 03/06/21  Yes Landree Fernholz, Canary Brim, MD  atorvastatin (LIPITOR) 80 MG tablet Take 1 tablet by mouth daily. 01/21/14 01/21/15  [provider]  gabapentin (NEURONTIN) 100 MG capsule Take 1 capsule by mouth daily. 09/27/14   [provider]  lisinopril (PRINIVIL,ZESTRIL) 20 MG tablet Take 1 tablet by mouth daily. 10/18/14   [provider]  metFORMIN (GLUCOPHAGE) 1000 MG tablet Take 1 tablet by mouth 2 (two) times daily with a meal. 10/18/14    [provider]    Allergies    Beeswax, Cyclobenzaprine, and Losartan  Review of Systems   Review of Systems  Gastrointestinal:  Positive for abdominal pain and rectal pain.  Genitourinary:  Positive for dysuria.  All other systems reviewed and are negative.  Physical Exam Updated Vital Signs BP (!) 146/87    Pulse 97    Temp 98.9 F (37.2 C)    Resp 18    Ht 5\' 7"  (1.702 m)    Wt 100.7 kg    SpO2 91%    BMI 34.77 kg/m   Physical Exam Vitals and nursing note reviewed.  Constitutional:      General: He is not in acute distress.    Appearance: Normal appearance. He is well-developed.  HENT:     Head: Normocephalic and atraumatic.     Right Ear: Hearing normal.     Left Ear: Hearing normal.     Nose: Nose normal.  Eyes:     Conjunctiva/sclera: Conjunctivae normal.     Pupils: Pupils are equal, round, and reactive to light.  Cardiovascular:     Rate and Rhythm: Regular rhythm.     Heart sounds: S1 normal and S2 normal. No murmur heard.   No friction rub. No gallop.  Pulmonary:     Effort: Pulmonary effort is normal. No respiratory distress.     Breath sounds: Normal breath sounds.  Chest:     Chest wall: No tenderness.  Abdominal:     General: Bowel sounds are normal.     Palpations: Abdomen is soft.     Tenderness: There is abdominal tenderness in the left lower quadrant. There is no guarding or rebound. Negative signs include Murphy's sign and McBurney's sign.     Hernia: No hernia is present.  Musculoskeletal:        General: Normal range of motion.     Cervical back: Normal range of motion and neck supple.  Skin:    General: Skin is warm and dry.     Findings: No rash.  Neurological:     Mental Status: He is alert and oriented to person, place, and time.     GCS: GCS eye subscore is 4. GCS verbal subscore is 5. GCS motor subscore is 6.     Cranial Nerves: No cranial nerve deficit.     Sensory: No sensory deficit.     Coordination: Coordination normal.   Psychiatric:        Speech: Speech normal.        Behavior: Behavior normal.        Thought Content: Thought content normal.    ED Results / Procedures / Treatments   Labs (all labs ordered are listed, but only abnormal results are displayed) Labs Reviewed  CBC WITH DIFFERENTIAL/PLATELET - Abnormal; Notable for the following components:      Result Value   WBC 13.3 (*)    Neutro Abs 9.4 (*)    Monocytes Absolute 1.1 (*)    All other components within normal limits  BASIC METABOLIC PANEL - Abnormal; Notable for the following components:   Sodium 132 (*)    Glucose, Bld 201 (*)    All other components within normal limits  URINALYSIS, ROUTINE W REFLEX MICROSCOPIC - Abnormal; Notable for the following components:   Specific Gravity, Urine >1.030 (*)    All other components within normal limits    EKG None  Radiology No results found.  Procedures Procedures   Medications Ordered in ED Medications  Ampicillin-Sulbactam (UNASYN) 3 g in sodium chloride 0.9 % 100 mL IVPB (has no administration in time range)  sodium chloride 0.9 % bolus 500 mL (0 mLs Intravenous Stopped 03/06/21 0542)  ondansetron (ZOFRAN) injection 4 mg (4 mg Intravenous Given 03/06/21 0441)  HYDROmorphone (DILAUDID) injection 1 mg (1 mg Intravenous Given 03/06/21 0441)    ED Course  I have reviewed the triage vital signs and the nursing notes.  Pertinent labs & imaging results that were available during my care of the patient were reviewed by me and considered in my medical decision making (see chart for details).    MDM Rules/Calculators/A&P                         Patient presents to the emergency department for evaluation of abdominal pain.  Patient has had 4 days of progressively worsening lower abdominal pain.  He has tenderness without guarding or rebound in the left lower quadrant.  Patient reports rectal pain with defecation as well as increased pain with urination.  Urinalysis does not suggest  infection.  This is likely bladder irritation from an acute diverticulitis.  He has a mild leukocytosis but no fever.  He appears well otherwise.  Based on his work-up and examination, I do not suspect any complication from diverticulitis.  Discussed CT scan to confirm diagnosis versus empiric treatment.  Patient would prefer to be empirically treated.  Given Unasyn,  discharged on Augmentin.  Patient counseled to come back to the ER immediately for any worsening pain or fever.     Final Clinical Impression(s) / ED Diagnoses Final diagnoses:  Diverticulitis    Rx / DC Orders ED Discharge Orders          Ordered    amoxicillin-clavulanate (AUGMENTIN) 875-125 MG tablet  2 times daily        03/06/21 0623    HYDROcodone-acetaminophen (NORCO/VICODIN) 5-325 MG tablet  Every 4 hours PRN        03/06/21 9407             Gilda Crease, MD 03/06/21 [phone]

## 2021-11-13 ENCOUNTER — Ambulatory Visit: Payer: Medicare (Managed Care) | Admitting: *Deleted

## 2021-11-16 ENCOUNTER — Other Ambulatory Visit (HOSPITAL_BASED_OUTPATIENT_CLINIC_OR_DEPARTMENT_OTHER): Payer: Self-pay

## 2021-11-16 DIAGNOSIS — R5383 Other fatigue: Secondary | ICD-10-CM

## 2021-11-16 DIAGNOSIS — G4710 Hypersomnia, unspecified: Secondary | ICD-10-CM

## 2021-11-16 DIAGNOSIS — R0683 Snoring: Secondary | ICD-10-CM

## 2021-11-16 DIAGNOSIS — R0681 Apnea, not elsewhere classified: Secondary | ICD-10-CM

## 2021-11-23 ENCOUNTER — Encounter: Payer: Self-pay | Admitting: *Deleted

## 2022-07-18 ENCOUNTER — Other Ambulatory Visit: Payer: Self-pay | Admitting: *Deleted

## 2022-07-18 ENCOUNTER — Telehealth: Payer: Self-pay | Admitting: *Deleted

## 2022-07-18 DIAGNOSIS — Z1211 Encounter for screening for malignant neoplasm of colon: Secondary | ICD-10-CM

## 2022-07-18 MED ORDER — PEG 3350-KCL-NABCB-NACL-NASULF 236 G PO SOLR: 4000.0000 mL | Freq: Once | ORAL | 0 refills | Status: AC

## 2022-07-18 NOTE — Telephone Encounter (Signed)
Was not able to complete the colonoscopy on 12/03/2013 due to blood pressure being too high. Never called back to reschedule.  Now taking blood pressure medication.

## 2022-07-18 NOTE — Telephone Encounter (Signed)
Gastroenterology Pre-Procedure Review  Request Date: 08/27/2022 Requesting Physician: Dr. Allegra Lai  PATIENT REVIEW QUESTIONS: The patient responded to the following health history questions as indicated:    1. Are you having any GI issues? no 2. Do you have a personal history of Polyps? no 3. Do you have a family history of Colon Cancer or Polyps? no 4. Diabetes Mellitus? yes (taking metformin) 5. Joint replacements in the past 12 months?no 6. Major health problems in the past 3 months?no 7. Any artificial heart valves, MVP, or defibrillator?no    MEDICATIONS & ALLERGIES:    Patient reports the following regarding taking any anticoagulation/antiplatelet therapy:   Plavix, Coumadin, Eliquis, Xarelto, Lovenox, Pradaxa, Brilinta, or Effient? no Aspirin? no  Patient confirms/reports the following medications:  Current Outpatient Medications  Medication Sig Dispense Refill   amoxicillin-clavulanate (AUGMENTIN) 875-125 MG tablet Take 1 tablet by mouth 2 (two) times daily. 20 tablet 0   atorvastatin (LIPITOR) 80 MG tablet Take 1 tablet by mouth daily.     gabapentin (NEURONTIN) 100 MG capsule Take 1 capsule by mouth daily.  10   HYDROcodone-acetaminophen (NORCO/VICODIN) 5-325 MG tablet Take 1 tablet by mouth every 4 (four) hours as needed for moderate pain. 10 tablet 0   lisinopril (PRINIVIL,ZESTRIL) 20 MG tablet Take 1 tablet by mouth daily.  5   metFORMIN (GLUCOPHAGE) 1000 MG tablet Take 1 tablet by mouth 2 (two) times daily with a meal.  7   No current facility-administered medications for this visit.    Patient confirms/reports the following allergies:  Allergies  Allergen Reactions   Beeswax Anaphylaxis   Cyclobenzaprine     Other reaction(s): not effective   Losartan Nausea Only    No orders of the defined types were placed in this encounter.   AUTHORIZATION INFORMATION Primary Insurance: 1D#: Group #:  Secondary Insurance: 1D#: Group #:  SCHEDULE INFORMATION: Date:  08/27/2022 Time: Location: [facility]

## 2022-08-20 ENCOUNTER — Telehealth: Payer: Self-pay

## 2022-08-20 ENCOUNTER — Encounter: Payer: Self-pay | Admitting: Gastroenterology

## 2022-08-20 NOTE — Telephone Encounter (Signed)
Received staff message from [place] in [facility] stating that pt did not receive his instructions or prep for his colonoscopy.   Pt confirmed mailing address is correct.  Informed him that his rx and instructions were sent on 07/18/22 upon scheduling.  Since his procedure is a week from today-he may not receive instructions in time. Colonoscopy has been rescheduled to 10/15/22.  Endoscopy notified of date change.  Thanks, [place], [place]

## 2022-10-14 ENCOUNTER — Telehealth: Payer: Self-pay | Admitting: *Deleted

## 2022-10-14 NOTE — Telephone Encounter (Signed)
Trish called me regarding this patient. He requested to cancel his procedure because he have poison ivy.  I have tried to call patient back but no answer so I have left a voicemail.

## 2022-10-15 ENCOUNTER — Encounter: Admission: RE | Payer: Self-pay | Source: Home / Self Care

## 2022-10-15 ENCOUNTER — Ambulatory Visit: Admission: RE | Admit: 2022-10-15 | Payer: Medicare HMO | Source: Home / Self Care | Admitting: Gastroenterology

## 2022-10-15 SURGERY — COLONOSCOPY WITH PROPOFOL
Anesthesia: General

## 2022-10-15 NOTE — Telephone Encounter (Signed)
Message left for patient to return my call.  

## 2022-10-16 NOTE — Telephone Encounter (Signed)
Message left for patient to return my call.  

## 2023-01-09 ENCOUNTER — Ambulatory Visit: Payer: Medicare HMO | Attending: Cardiology | Admitting: Cardiology

## 2023-01-10 ENCOUNTER — Encounter: Payer: Self-pay | Admitting: Cardiology

## 2023-03-07 ENCOUNTER — Other Ambulatory Visit (HOSPITAL_COMMUNITY): Payer: Self-pay | Admitting: Family Medicine

## 2023-03-07 DIAGNOSIS — M79605 Pain in left leg: Secondary | ICD-10-CM

## 2023-03-31 ENCOUNTER — Ambulatory Visit: Payer: Medicare HMO | Attending: Cardiology | Admitting: Cardiology
